# Patient Record
Sex: Female | Born: 1965 | Race: Black or African American | Hispanic: No | Marital: Single | Smoking: Never smoker
Health system: Southern US, Community
[De-identification: ages and names within clinical notes are randomized; demographics above are authoritative.]

## PROBLEM LIST (undated history)

## (undated) DIAGNOSIS — I10 Essential (primary) hypertension: Secondary | ICD-10-CM

## (undated) DIAGNOSIS — J45909 Unspecified asthma, uncomplicated: Secondary | ICD-10-CM

## (undated) DIAGNOSIS — L509 Urticaria, unspecified: Secondary | ICD-10-CM

## (undated) HISTORY — DX: Urticaria, unspecified: L50.9

## (undated) HISTORY — PX: KNEE SURGERY: SHX244

## (undated) HISTORY — DX: Unspecified asthma, uncomplicated: J45.909

## (undated) HISTORY — DX: Essential (primary) hypertension: I10

---

## 2018-11-21 ENCOUNTER — Other Ambulatory Visit: Payer: Self-pay | Admitting: Internal Medicine

## 2018-11-21 DIAGNOSIS — Z1231 Encounter for screening mammogram for malignant neoplasm of breast: Secondary | ICD-10-CM

## 2019-10-18 ENCOUNTER — Other Ambulatory Visit: Payer: Self-pay

## 2019-10-18 DIAGNOSIS — Z20822 Contact with and (suspected) exposure to covid-19: Secondary | ICD-10-CM

## 2019-10-20 LAB — NOVEL CORONAVIRUS, NAA: SARS-CoV-2, NAA: NOT DETECTED

## 2019-11-19 ENCOUNTER — Other Ambulatory Visit: Payer: Self-pay | Admitting: Obstetrics and Gynecology

## 2019-11-19 ENCOUNTER — Other Ambulatory Visit (HOSPITAL_COMMUNITY)
Admission: RE | Admit: 2019-11-19 | Discharge: 2019-11-19 | Disposition: A | Discharge status: Home / Self Care | Payer: 59 | Source: Ambulatory Visit | Attending: Obstetrics and Gynecology | Admitting: Obstetrics and Gynecology

## 2019-11-19 DIAGNOSIS — Z01419 Encounter for gynecological examination (general) (routine) without abnormal findings: Secondary | ICD-10-CM | POA: Diagnosis present

## 2019-11-21 ENCOUNTER — Other Ambulatory Visit: Payer: Self-pay | Admitting: Obstetrics and Gynecology

## 2019-11-21 DIAGNOSIS — Z1231 Encounter for screening mammogram for malignant neoplasm of breast: Secondary | ICD-10-CM

## 2019-11-22 ENCOUNTER — Other Ambulatory Visit: Payer: Self-pay

## 2019-11-22 ENCOUNTER — Ambulatory Visit
Admission: RE | Admit: 2019-11-22 | Discharge: 2019-11-22 | Disposition: A | Discharge status: Home / Self Care | Payer: 59 | Source: Ambulatory Visit | Attending: Obstetrics and Gynecology | Admitting: Obstetrics and Gynecology

## 2019-11-22 DIAGNOSIS — Z1231 Encounter for screening mammogram for malignant neoplasm of breast: Secondary | ICD-10-CM

## 2019-11-27 ENCOUNTER — Other Ambulatory Visit: Payer: Self-pay | Admitting: Obstetrics and Gynecology

## 2019-11-27 DIAGNOSIS — R928 Other abnormal and inconclusive findings on diagnostic imaging of breast: Secondary | ICD-10-CM

## 2019-11-29 ENCOUNTER — Other Ambulatory Visit: Payer: Self-pay

## 2019-11-29 ENCOUNTER — Ambulatory Visit
Admission: RE | Admit: 2019-11-29 | Discharge: 2019-11-29 | Disposition: A | Discharge status: Home / Self Care | Payer: 59 | Source: Ambulatory Visit | Attending: Obstetrics and Gynecology | Admitting: Obstetrics and Gynecology

## 2019-11-29 DIAGNOSIS — R928 Other abnormal and inconclusive findings on diagnostic imaging of breast: Secondary | ICD-10-CM

## 2020-02-26 ENCOUNTER — Other Ambulatory Visit: Payer: Self-pay | Admitting: Orthopedic Surgery

## 2020-02-26 DIAGNOSIS — M542 Cervicalgia: Secondary | ICD-10-CM

## 2020-03-01 NOTE — Progress Notes (Signed)
Cardiology Office Note:   Date:  03/04/2020  NAME:  Cassie Mahoney    MRN: [id number] DOB:  08-Nov-1966   PCP:  Renford Dills, MD  Cardiologist:  No primary care provider on file.   Referring MD: Renford Dills, MD   Chief Complaint  Patient presents with  . Chest Pain   History of Present Illness:   Cassie Mahoney is a 54 y.o. female with a hx of hypertension who is being seen today for the evaluation of chest pain at the request of Renford Dills, MD. Evaluated in the ER 2/19 for atypical CP. Troponin negative and EKG normal. Follow-up with [place] today.  She reports back in February she had an event of paralysis while talking to a colleague.  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  She reports work is stressful lately.  She reports while talking with an employee she felt like she could not move.  She did have some left-sided soreness in her chest or arm which she is unable to delineate.  Is reported as a throbbing pain that lasted seconds and resolved.  The whole paralysis that lasted 2 to 3 minutes.  She had no deficits such as slurred speech or inability to move extremities.  She just felt like she could not move or talk.  She was evaluated the emergency room and then referred to cardiology.  Her primary care physician seems to be more concerned about the chest pain and wanted her evaluated here as well.  She reports that she is never had a heart attack or stroke.  Her risk factors for heart disease include hypertension but is well controlled today.  She does snore and falls asleep easily and her primary care physician has concerns for sleep apnea.  I do not have a recent lipid profile.  She is recently relocated from [place] and is establishing care here.  She reports that she does not exercise routinely but denies any chest pain or trouble breathing with her current level of activity.  She is obese but no other strong CVD risk factors other than what is mentioned above.  It appears this chest  pain was a one-time incident and she has not had any further episodes.  Her EKG demonstrates normal sinus rhythm with no acute ST-T changes or evidence of prior infarction.  Past Medical History: Past Medical History:  Diagnosis Date  . Hypertension     Past Surgical History: Past Surgical History:  Procedure Laterality Date  . KNEE SURGERY      Current Medications: Current Meds  Medication Sig  . amLODipine (NORVASC) 10 MG tablet Take 10 mg by mouth daily.  Marland Kitchen ascorbic acid (VITAMIN C) 100 MG tablet Take by mouth.  . Cholecalciferol 25 MCG (1000 UT) capsule Take by mouth.  . losartan-hydrochlorothiazide (HYZAAR) 100-25 MG tablet Take by mouth.  . montelukast (SINGULAIR) 10 MG tablet Take by mouth.  . Multiple Vitamin (MULTIVITAMIN) capsule Take by mouth.  . potassium chloride (KLOR-CON) 20 MEQ packet Take by mouth.     Allergies:    Latex   Social History: Social History   Socioeconomic History  . Marital status: Single    Spouse name: Not on file  . Number of children: 1  . Years of education: Not on file  . Highest education level: Not on file  Occupational History  . Occupation: Health and safety inspector lab  Tobacco Use  . Smoking status: Never Smoker  . Smokeless tobacco: Never Used  Substance  and Sexual Activity  . Alcohol use: Not Currently  . Drug use: Not Currently  . Sexual activity: Not on file  Other Topics Concern  . Not on file  Social History Narrative  . Not on file   Social Determinants of Health   Financial Resource Strain:   . Difficulty of Paying Living Expenses:   Food Insecurity:   . Worried About Charity fundraiser in the Last Year:   . Arboriculturist in the Last Year:   Transportation Needs:   . Film/video editor (Medical):   Marland Kitchen Lack of Transportation (Non-Medical):   Physical Activity:   . Days of Exercise per Week:   . Minutes of Exercise per Session:   Stress:   . Feeling of Stress :   Social Connections:   . Frequency of  Communication with Friends and Family:   . Frequency of Social Gatherings with Friends and Family:   . Attends Religious Services:   . Active Member of Clubs or Organizations:   . Attends Archivist Meetings:   Marland Kitchen Marital Status:      Family History: The patient's family history includes Heart attack in her mother; Hypertension in her father and mother.  ROS:   All other ROS reviewed and negative. Pertinent positives noted in the HPI.     EKGs/Labs/Other Studies Reviewed:   The following studies were personally reviewed by me today:  EKG:  EKG is ordered today.  The ekg ordered today demonstrates normal sinus rhythm, heart rate 90, no acute ST-T changes, no evidence of prior infarction, and was personally reviewed by me.   Recent Labs: No results found for requested labs within last 8760 hours.   Recent Lipid Panel No results found for: CHOL, TRIG, HDL, CHOLHDL, VLDL, LDLCALC, LDLDIRECT  Physical Exam:   VS:  BP 124/78   Pulse 90   Temp (!) 97 F (36.1 C)   Ht 5\' 8"  (1.727 m)   Wt 279 lb (126.6 kg)   SpO2 98%   BMI 42.42 kg/m    Wt Readings from Last 3 Encounters:  03/04/20 279 lb (126.6 kg)    General: Well nourished, well developed, in no acute distress Heart: Atraumatic, normal size  Eyes: PEERLA, EOMI  Neck: Supple, no JVD Endocrine: No thryomegaly Cardiac: Normal S1, S2; RRR; no murmurs, rubs, or gallops Lungs: Clear to auscultation bilaterally, no wheezing, rhonchi or rales  Abd: Soft, nontender, no hepatomegaly  Ext: No edema, pulses 2+ Musculoskeletal: No deformities, BUE and BLE strength normal and equal Skin: Warm and dry, no rashes   Neuro: Alert and oriented to person, place, time, and situation, CNII-XII grossly intact, no focal deficits  Psych: Normal mood and affect   ASSESSMENT:   Cassie Mahoney is a 54 y.o. female who presents for the following: 1. Chest pain, unspecified type   2. Essential hypertension   3. Obesity, morbid, BMI  40.0-49.9 ([place])     PLAN:   1. Chest pain, unspecified type 2. Essential hypertension 3. Obesity, morbid, BMI 40.0-49.9 ([place]) -She presents after a one-time episode of paralysis with associated left-sided throbbing chest pain.  She is not had recurrence of the chest pain.  Her EKG today shows normal sinus rhythm without acute ST-T changes or evidence of prior infarction.  Her main CVD risk factors are hypertension which is well controlled as well as morbid obesity.  There is also concern for sleep apnea.  She is not had any further recurrence.  She reports no chest pain or shortness of breath with her current level of activity.  She has no murmurs or abnormalities detected on my examination today.  I discussed with her the possibility of pursuing a cardiac CTA just to make sure she does not have any obstructive CAD.  I think the chest pain and paralysis are very unrelated.  I am unsure if she had some sort of neurological event or possibly a stress reaction.  I encouraged her to follow with her primary care physician for further evaluation of this.  She reports for now she would like to hold on a cardiac CTA.  Should she have further episodes she will let us know and we will go ahead and order that.  We will see her back in 3 months to reevaluate her symptoms.  Overall I have a low suspicion this is cardiac chest pain I suspect this is noncardiac chest pain in the setting of some neurological event.   Disposition: Return in about 3 months (around 06/04/2020).  Medication Adjustments/Labs and Tests Ordered: Current medicines are reviewed at length with the patient today.  Concerns regarding medicines are outlined above.  Orders Placed This Encounter  Procedures  . EKG 12-Lead   No orders of the defined types were placed in this encounter.   Patient Instructions  Medication Instructions:  The current medical regimen is effective;  continue present plan and medications.  *If you need a refill on  your cardiac medications before your next appointment, please call your pharmacy*   Follow-Up: At [facility], you and your health needs are our priority.  As part of our continuing mission to provide you with exceptional heart care, we have created designated Provider Care Teams.  These Care Teams include your primary Cardiologist (physician) and Advanced Practice Providers (APPs -  Physician Assistants and Nurse Practitioners) who all work together to provide you with the care you need, when you need it.  We recommend signing up for the patient portal called "MyChart".  Sign up information is provided on this After Visit Summary.  MyChart is used to connect with patients for Virtual Visits (Telemedicine).  Patients are able to view lab/test results, encounter notes, upcoming appointments, etc.  Non-urgent messages can be sent to your provider as well.   To learn more about what you can do with MyChart, go to [email or web address].    Your next appointment:   3 month(s)  The format for your next appointment:   Virtual Visit   Provider:   Lennie Odor, MD      Signed, Lenna Gilford. Flora Lipps, MD [facility]  [address], [address] [place], [place] [address] [phone]  03/04/2020 10:15 AM

## 2020-03-04 ENCOUNTER — Ambulatory Visit (INDEPENDENT_AMBULATORY_CARE_PROVIDER_SITE_OTHER): Payer: 59 | Admitting: Cardiovascular Disease

## 2020-03-04 ENCOUNTER — Other Ambulatory Visit: Payer: Self-pay

## 2020-03-04 ENCOUNTER — Encounter: Payer: Self-pay | Admitting: Cardiovascular Disease

## 2020-03-04 VITALS — BP 124/78 | HR 90 | Temp 97.0°F | Ht 68.0 in | Wt 279.0 lb

## 2020-03-04 DIAGNOSIS — R079 Chest pain, unspecified: Secondary | ICD-10-CM | POA: Diagnosis not present

## 2020-03-04 DIAGNOSIS — I10 Essential (primary) hypertension: Secondary | ICD-10-CM

## 2020-03-04 NOTE — Patient Instructions (Signed)
Medication Instructions:  The current medical regimen is effective;  continue present plan and medications.  *If you need a refill on your cardiac medications before your next appointment, please call your pharmacy*   Follow-Up: At [facility], you and your health needs are our priority.  As part of our continuing mission to provide you with exceptional heart care, we have created designated Provider Care Teams.  These Care Teams include your primary Cardiologist (physician) and Advanced Practice Providers (APPs -  Physician Assistants and Nurse Practitioners) who all work together to provide you with the care you need, when you need it.  We recommend signing up for the patient portal called "MyChart".  Sign up information is provided on this After Visit Summary.  MyChart is used to connect with patients for Virtual Visits (Telemedicine).  Patients are able to view lab/test results, encounter notes, upcoming appointments, etc.  Non-urgent messages can be sent to your provider as well.   To learn more about what you can do with MyChart, go to [email or web address].    Your next appointment:   3 month(s)  The format for your next appointment:   Virtual Visit   Provider:   Lennie Odor, MD

## 2020-03-26 ENCOUNTER — Ambulatory Visit
Admission: RE | Admit: 2020-03-26 | Discharge: 2020-03-26 | Disposition: A | Discharge status: Home / Self Care | Payer: 59 | Source: Ambulatory Visit | Attending: Orthopedic Surgery | Admitting: Orthopedic Surgery

## 2020-03-26 ENCOUNTER — Other Ambulatory Visit: Payer: Self-pay

## 2020-03-26 DIAGNOSIS — M542 Cervicalgia: Secondary | ICD-10-CM

## 2020-05-11 NOTE — Progress Notes (Signed)
Virtual Visit via Telephone Note   This visit type was conducted due to national recommendations for restrictions regarding the COVID-19 Pandemic (e.g. social distancing) in an effort to limit this patient's exposure and mitigate transmission in our community.  Due to her co-morbid illnesses, this patient is at least at moderate risk for complications without adequate follow up.  This format is felt to be most appropriate for this patient at this time.  The patient did not have access to video technology/had technical difficulties with video requiring transitioning to audio format only (telephone).  All issues noted in this document were discussed and addressed.  No physical exam could be performed with this format.  Please refer to the patient's chart for her  consent to telehealth for [facility].   The patient was identified using 2 identifiers.  Date:  05/15/2020   ID:  Cassie Mahoney, DOB 1966/08/22, MRN [id number]  Patient Location: Home Provider Location: Office  PCP:  Renford Dills, MD  Cardiologist:  No primary care provider on file.   Evaluation Performed:  Follow-Up Visit  Chief Complaint:  Chest pain  History of Present Illness:    Cassie Mahoney is a 54 y.o. female with a hx of obesity, hypertension who presents for follow-up of chest pain. Evaluated 3/24 for atypical CP and left arm paralysis. Had normal EKG. No testing pursued due to atypical symptoms, normal EKG, and no further recurrence. Follow-up today.   She reports has not had any further episodes of chest pain.  She reports was at work and had left arm paralysis with a throbbing sensation in her chest.  I did evaluate her and her EKG was normal.  We decided for watchful waiting.  She said no further recurrence of episodes of chest pain.  She reports she is started exercise more.  This includes 15 to 20-minute of vigorous exercise aerobic activity.  This includes dancing or using videos at home.  She reports she can also  do less vigorous activity for up to 30 minutes without any symptoms of chest pain or shortness of breath.  She started to work on the diet.  Given no further recurrence of chest pain we discussed that watchful waiting is likely the best plan.  Should she develop further chest pain she will reach back out to [place].  The patient does not have symptoms concerning for COVID-19 infection (fever, chills, cough, or new shortness of breath).    Past Medical History:  Diagnosis Date  . Hypertension    Past Surgical History:  Procedure Laterality Date  . KNEE SURGERY       Current Meds  Medication Sig  . amLODipine (NORVASC) 10 MG tablet Take 10 mg by mouth daily.  Marland Kitchen ascorbic acid (VITAMIN C) 100 MG tablet Take by mouth.  . Cholecalciferol 25 MCG (1000 UT) capsule Take by mouth.  . losartan-hydrochlorothiazide (HYZAAR) 100-25 MG tablet Take by mouth.  . montelukast (SINGULAIR) 10 MG tablet Take by mouth.  . Multiple Vitamin (MULTIVITAMIN) capsule Take by mouth.  . potassium chloride (KLOR-CON) 20 MEQ packet Take by mouth.     Allergies:   Latex   Social History   Tobacco Use  . Smoking status: Never Smoker  . Smokeless tobacco: Never Used  Substance Use Topics  . Alcohol use: Not Currently  . Drug use: Not Currently     Family Hx: The patient's family history includes Heart attack in her mother; Hypertension in her father and mother.  ROS:  Please see the history of present illness.     All other systems reviewed and are negative.   Prior CV studies:   The following studies were reviewed today:  Labs/Other Tests and Data Reviewed:    EKG:  No ECG reviewed.  Recent Labs: No results found for requested labs within last 8760 hours.   Recent Lipid Panel No results found for: CHOL, TRIG, HDL, CHOLHDL, LDLCALC, LDLDIRECT  Wt Readings from Last 3 Encounters:  05/15/20 275 lb (124.7 kg)  03/04/20 279 lb (126.6 kg)     Objective:    Vital Signs:  Ht 5\' 8"  (1.727 m)   Wt  275 lb (124.7 kg)   BMI 41.81 kg/m    VITAL SIGNS:  reviewed  General: No acute distress Pulmonary: No shortness of breath over the phone Psych: Normal mood and affect  ASSESSMENT & PLAN:    1. Chest pain, unspecified type -She was evaluated in March for very atypical chest pain.  She had left arm paralysis with throbbing left chest pain.  This was a one-time occurrence.  Likely associated with stress at work.  She had no further recurrence of chest pain since that time.  She is able to maintain a high level of activity without any symptoms of chest pain.  We will hold on a cardiac CTA for now.  I see no further need for cardiac testing.  She will see [place] as needed.  2. Essential hypertension -No change in medications today.  She will follow the primary care physician.  COVID-19 Education: The signs and symptoms of COVID-19 were discussed with the patient and how to seek care for testing (follow up with PCP or arrange E-visit).  The importance of social distancing was discussed today.  Time:  Today, I have spent 15 minutes with the patient with telehealth technology discussing the above problems.     Medication Adjustments/Labs and Tests Ordered: Current medicines are reviewed at length with the patient today.  Concerns regarding medicines are outlined above.   Tests Ordered: No orders of the defined types were placed in this encounter.   Medication Changes: No orders of the defined types were placed in this encounter.   Follow Up:  Either In Person or Virtual prn  Signed, Evalina Field, MD  05/15/2020 10:20 AM    [place]

## 2020-05-15 ENCOUNTER — Encounter: Payer: Self-pay | Admitting: Cardiovascular Disease

## 2020-05-15 ENCOUNTER — Telehealth (INDEPENDENT_AMBULATORY_CARE_PROVIDER_SITE_OTHER): Payer: 59 | Admitting: Cardiovascular Disease

## 2020-05-15 VITALS — Ht 68.0 in | Wt 275.0 lb

## 2020-05-15 DIAGNOSIS — R079 Chest pain, unspecified: Secondary | ICD-10-CM

## 2020-05-15 DIAGNOSIS — I10 Essential (primary) hypertension: Secondary | ICD-10-CM | POA: Diagnosis not present

## 2020-05-15 NOTE — Patient Instructions (Signed)
Medication Instructions:  The current medical regimen is effective;  continue present plan and medications.  *If you need a refill on your cardiac medications before your next appointment, please call your pharmacy*    Follow-Up: At [facility], you and your health needs are our priority.  As part of our continuing mission to provide you with exceptional heart care, we have created designated Provider Care Teams.  These Care Teams include your primary Cardiologist (physician) and Advanced Practice Providers (APPs -  Physician Assistants and Nurse Practitioners) who all work together to provide you with the care you need, when you need it.  We recommend signing up for the patient portal called "MyChart".  Sign up information is provided on this After Visit Summary.  MyChart is used to connect with patients for Virtual Visits (Telemedicine).  Patients are able to view lab/test results, encounter notes, upcoming appointments, etc.  Non-urgent messages can be sent to your provider as well.   To learn more about what you can do with MyChart, go to [email or web address].    Your next appointment:   As needed  The format for your next appointment:   In Person  Provider:   [place] O'Neal, MD      

## 2020-10-29 ENCOUNTER — Other Ambulatory Visit: Payer: Self-pay | Admitting: Obstetrics and Gynecology

## 2020-10-29 DIAGNOSIS — Z1231 Encounter for screening mammogram for malignant neoplasm of breast: Secondary | ICD-10-CM

## 2020-12-10 ENCOUNTER — Other Ambulatory Visit: Payer: Self-pay

## 2020-12-10 ENCOUNTER — Ambulatory Visit
Admission: RE | Admit: 2020-12-10 | Discharge: 2020-12-10 | Disposition: A | Discharge status: Home / Self Care | Payer: 59 | Source: Ambulatory Visit | Attending: Obstetrics and Gynecology | Admitting: Obstetrics and Gynecology

## 2020-12-10 DIAGNOSIS — Z1231 Encounter for screening mammogram for malignant neoplasm of breast: Secondary | ICD-10-CM

## 2021-05-24 ENCOUNTER — Other Ambulatory Visit: Payer: Self-pay

## 2021-05-24 ENCOUNTER — Ambulatory Visit (INDEPENDENT_AMBULATORY_CARE_PROVIDER_SITE_OTHER): Payer: BC Managed Care – PPO

## 2021-05-24 ENCOUNTER — Ambulatory Visit (INDEPENDENT_AMBULATORY_CARE_PROVIDER_SITE_OTHER): Payer: BC Managed Care – PPO | Admitting: Podiatry

## 2021-05-24 DIAGNOSIS — M6740 Ganglion, unspecified site: Secondary | ICD-10-CM

## 2021-05-24 NOTE — Progress Notes (Signed)
   HPI: 55 y.o. female presenting today as a new patient for what she believes is a ganglion cyst to her left foot.  She also experiences it to the right foot but its not as severe.  This is been ongoing for about a month now.  She does have a history of ganglion cysts to the right wrist which went away spontaneously.  She states that she wears a brace when she has a flareup of the ganglion cyst to the right wrist.  She presents for further treatment and evaluation  Past Medical History:  Diagnosis Date   Hypertension      Physical Exam: General: The patient is alert and oriented x3 in no acute distress.  Dermatology: Skin is warm, dry and supple bilateral lower extremities. Negative for open lesions or macerations.  Vascular: Palpable pedal pulses bilaterally. No edema or erythema noted. Capillary refill within normal limits.  Neurological: Epicritic and protective threshold grossly intact bilaterally.   Musculoskeletal Exam: Range of motion within normal limits to all pedal and ankle joints bilateral. Muscle strength 5/5 in all groups bilateral. Fluctuant, non-adhered mass noted to the dorsal aspect of the left foot.  Radiographic Exam:  Normal osseous mineralization. Joint spaces preserved. No fracture/dislocation/boney destruction.    Assessment: 1.  Ganglion cyst left foot   Plan of Care:  1. Patient evaluated. X-Rays reviewed.  2.  Explained to the patient the etiology and diagnosis of a ganglion cyst.  Currently it is very minimally symptomatic.  I do not recommend surgery at this time and recommend conservative treatment. 3.  Recommend good supportive shoes that do not irritate or aggravate the dorsum of the left foot 4.  Compression ankle sleeve dispensed to wear as needed 5.  Return to clinic as needed  *Works for [employer]; makes glasses. Biggest contract is with [employer]      Felecia Shelling, DPM [facility]  Dr. Felecia Shelling, DPM    [address] [place], [place] [address]                Office [phone]  Fax [phone]

## 2021-09-24 ENCOUNTER — Other Ambulatory Visit: Payer: Self-pay | Admitting: Obstetrics and Gynecology

## 2021-09-24 DIAGNOSIS — Z1231 Encounter for screening mammogram for malignant neoplasm of breast: Secondary | ICD-10-CM

## 2022-07-06 IMAGING — MG DIGITAL SCREENING BILAT W/ TOMO W/ CAD
8 series · 8 of 24 positions shown · non-contrast
Comparison: Previous exam(s).

CLINICAL DATA: Screening.

EXAM:
DIGITAL SCREENING BILATERAL MAMMOGRAM WITH TOMO AND CAD

[R CC synth-2D]
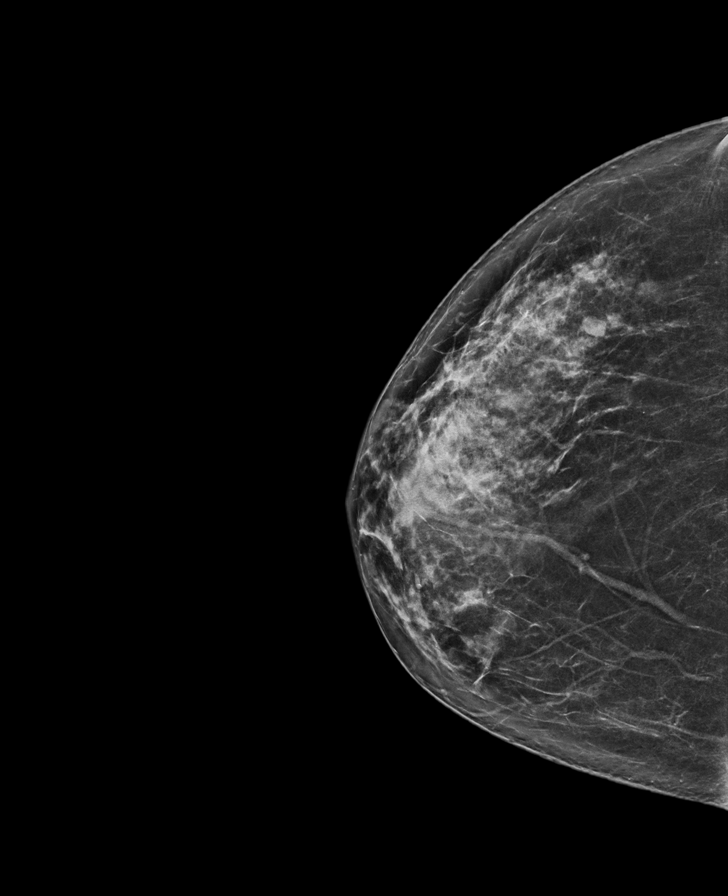

[L MLO synth-2D]
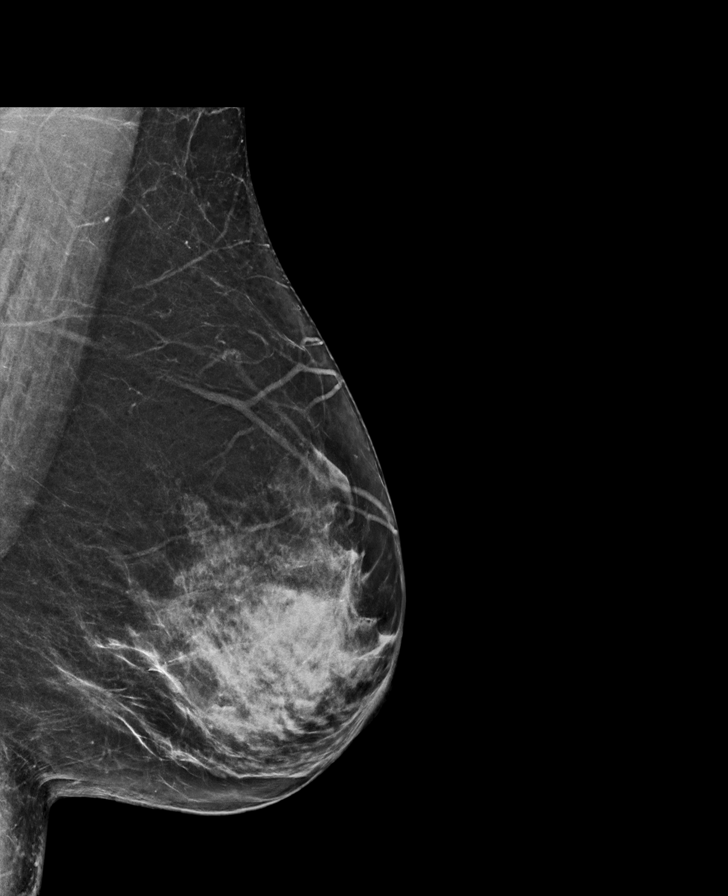

[L CC synth-2D]
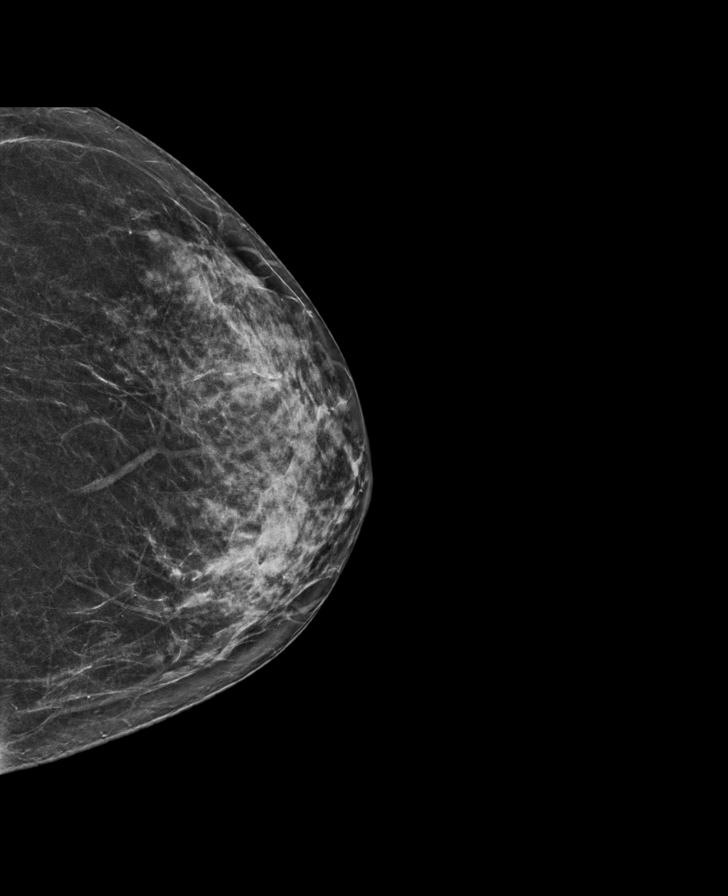

[R MLO synth-2D]
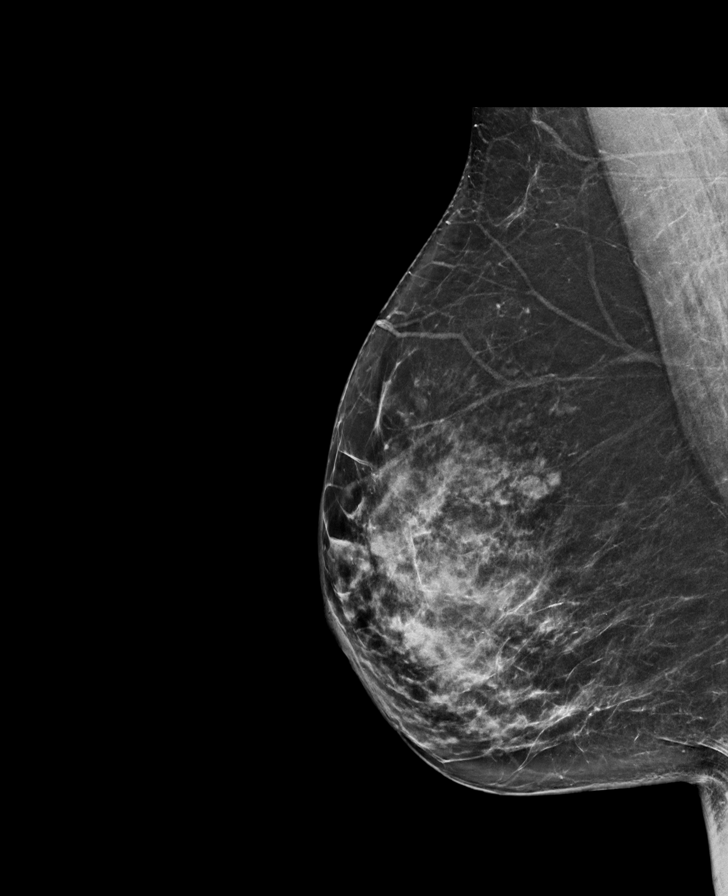

[R CC tomo · tomo slice 37/73.0]
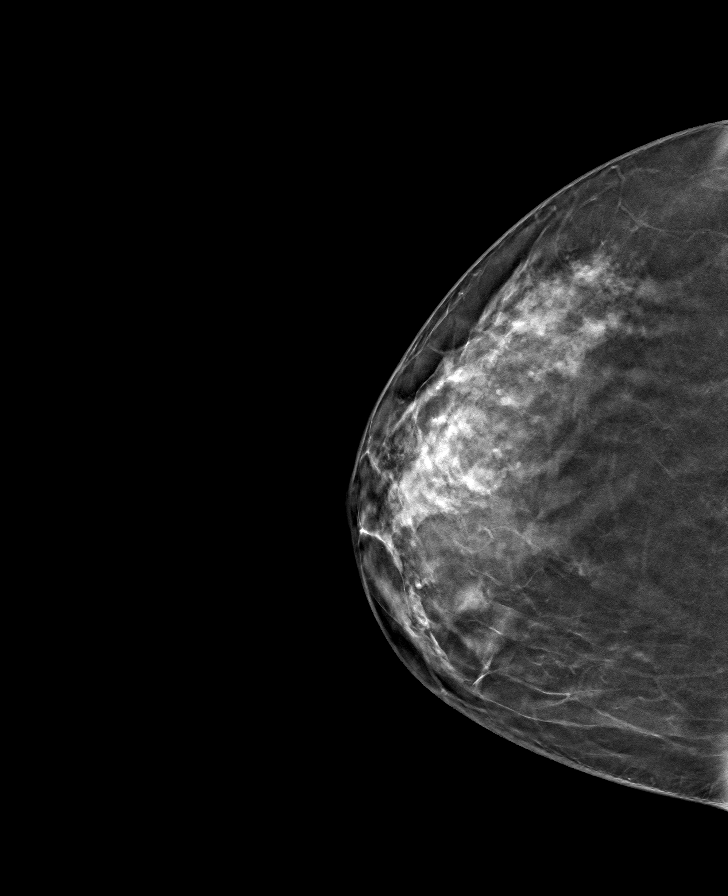

[R MLO tomo · tomo slice 38/75.0]
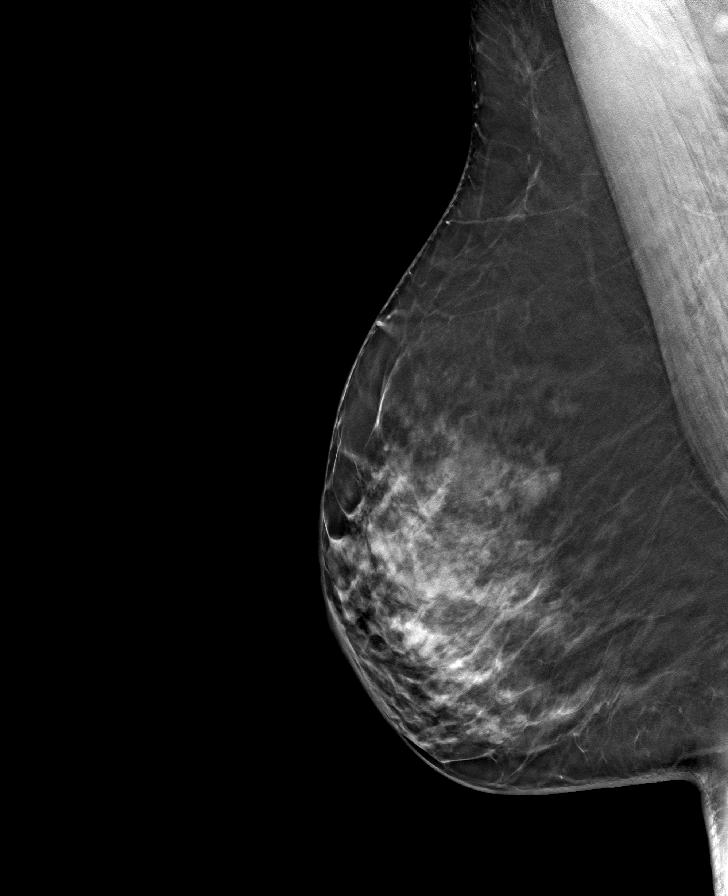

[L CC tomo · tomo slice 33/65.0]
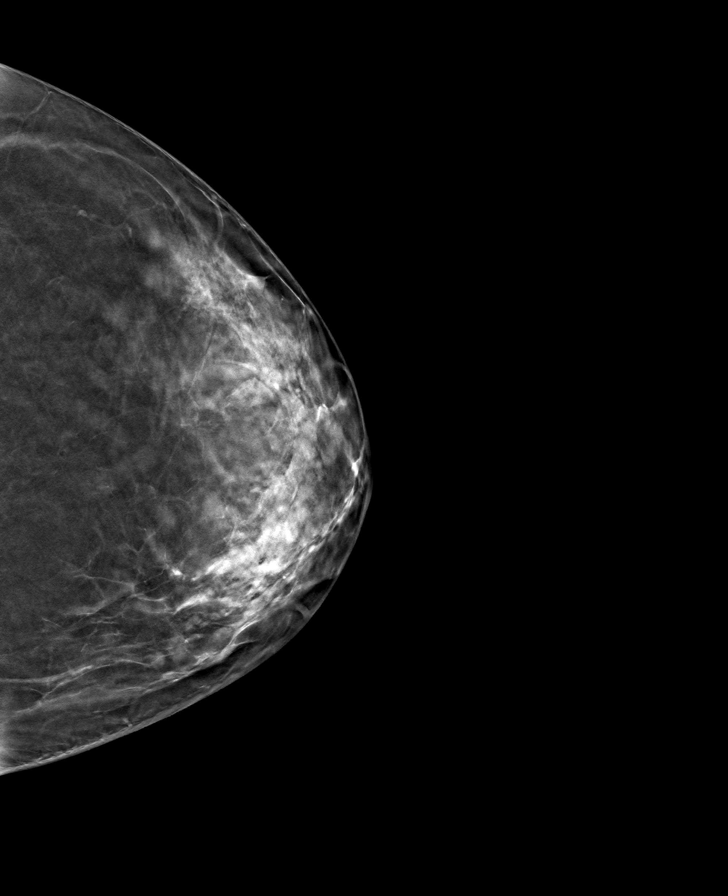

[L MLO tomo · tomo slice 37/73.0]
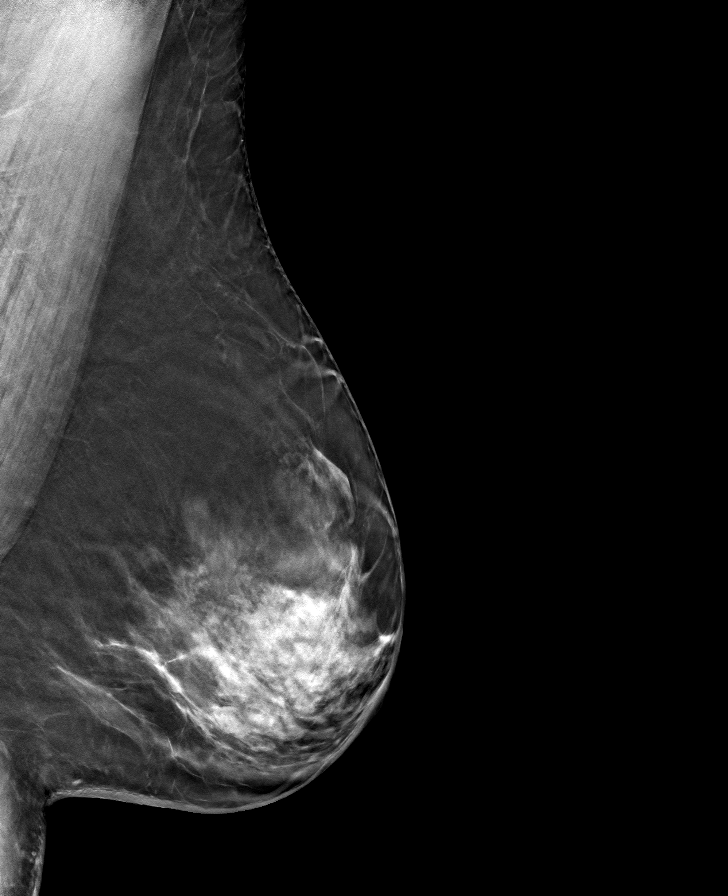

[8 of 24 positions shown; findings below may reference images not displayed]

ACR Breast Density Category c: The breast tissue is heterogeneously
dense, which may obscure small masses.
FINDINGS: There are no findings suspicious for malignancy. Images were
processed with CAD.
IMPRESSION: No mammographic evidence of malignancy. A result letter of this
screening mammogram will be mailed directly to the patient.

RECOMMENDATION:
Screening mammogram in one year. (Code:FT-U-LHB)

BI-RADS CATEGORY  1: Negative.

## 2023-05-15 ENCOUNTER — Ambulatory Visit (INDEPENDENT_AMBULATORY_CARE_PROVIDER_SITE_OTHER): Payer: Self-pay

## 2023-05-15 ENCOUNTER — Ambulatory Visit (INDEPENDENT_AMBULATORY_CARE_PROVIDER_SITE_OTHER): Payer: Self-pay | Admitting: Podiatry

## 2023-05-15 DIAGNOSIS — S92912A Unspecified fracture of left toe(s), initial encounter for closed fracture: Secondary | ICD-10-CM

## 2023-05-15 DIAGNOSIS — M79672 Pain in left foot: Secondary | ICD-10-CM

## 2023-05-24 NOTE — Progress Notes (Signed)
   Chief Complaint  Patient presents with   Toe Pain    Patient had a left foot 4th toe injury, patient hit her toe a month ago on her bed, toe has some swelling and pain, rate of pain 4 out of 10, patient is using compression socks to help with the pain, X-Rays done today     HPI: 57 y.o. female presenting today for new complaint of pain and tenderness associated to the left fourth toe when she hit her toe against her bed about 1 month ago.  She continues to have pain with swelling.  She was hoping that the pain would resolve but she continues to have pain and tenderness and presents for further treatment and evaluation.  Currently she has not anything for treatment  Past Medical History:  Diagnosis Date   Hypertension     Past Surgical History:  Procedure Laterality Date   KNEE SURGERY      Allergies  Allergen Reactions   Latex Hives   Apple Itching     Physical Exam: General: The patient is alert and oriented x3 in no acute distress.  Dermatology: Skin is warm, dry and supple bilateral lower extremities.   Vascular: Palpable pedal pulses bilaterally. Capillary refill within normal limits.  No erythema.  There is some moderate edema noted to the fourth digit of the left foot  Neurological: Grossly intact via light touch  Musculoskeletal Exam: No pedal deformities noted.  Gross alignment of the toes noted.  Tenderness with palpation also noted to the fourth digit left foot  Radiographic Exam LT foot 05/15/2023:  Normal osseous mineralization.  Obliquely oriented nondisplaced fracture noted at the base of the proximal phalanx of the fourth digit.  Impression: Fracture fourth digit left foot proximal phalanx  Assessment/Plan of Care: 1.  Fracture fourth digit LT foot base of the proximal phalanx; closed, nondisplaced, initial encounter  -Patient evaluated.  X-rays reviewed -Recommend conservative treatment.  RICE -Postsurgical shoe dispensed.  WBAT x 4 weeks -Return to  clinic 6 weeks follow-up x-ray       Felecia Shelling, DPM [facility]  Dr. Felecia Shelling, DPM    [address] [place], [place] [address]                Office [phone]  Fax [phone]

## 2023-06-02 ENCOUNTER — Other Ambulatory Visit: Payer: Self-pay | Admitting: Podiatry

## 2023-06-02 DIAGNOSIS — T148XXA Other injury of unspecified body region, initial encounter: Secondary | ICD-10-CM

## 2023-06-02 DIAGNOSIS — S92912A Unspecified fracture of left toe(s), initial encounter for closed fracture: Secondary | ICD-10-CM

## 2023-06-26 ENCOUNTER — Ambulatory Visit: Payer: Self-pay | Admitting: Podiatry

## 2023-07-26 ENCOUNTER — Ambulatory Visit (INDEPENDENT_AMBULATORY_CARE_PROVIDER_SITE_OTHER): Payer: 59 | Admitting: Podiatry

## 2023-07-26 ENCOUNTER — Ambulatory Visit (INDEPENDENT_AMBULATORY_CARE_PROVIDER_SITE_OTHER): Payer: 59

## 2023-07-26 DIAGNOSIS — S92415D Nondisplaced fracture of proximal phalanx of left great toe, subsequent encounter for fracture with routine healing: Secondary | ICD-10-CM | POA: Diagnosis not present

## 2023-07-26 NOTE — Progress Notes (Signed)
   Chief Complaint  Patient presents with   Toe Injury    Still having some swelling, no pain. Wearing regular shoes.     HPI: 57 y.o. female presenting today for follow-up evaluation of a fracture to the left fourth toe which occurred around the beginning of May 2024 when she hit her foot against her bedpost.  Patient doing well.  She is back into regular shoes with no pain.  Past Medical History:  Diagnosis Date   Hypertension     Past Surgical History:  Procedure Laterality Date   KNEE SURGERY      Allergies  Allergen Reactions   Latex Hives   Apple Itching     Physical Exam: General: The patient is alert and oriented x3 in no acute distress.  Dermatology: Skin is warm, dry and supple bilateral lower extremities.   Vascular: Palpable pedal pulses bilaterally. Capillary refill within normal limits.  No erythema.  There is some moderate edema noted to the fourth digit of the left foot  Neurological: Grossly intact via light touch  Musculoskeletal Exam: No pedal deformities noted.  Gross alignment of the toes noted.  Negative for any appreciable tenderness with palpation also noted to the fourth digit left foot  Radiographic Exam LT foot 05/15/2023:  Normal osseous mineralization.  Obliquely oriented nondisplaced fracture noted at the base of the proximal phalanx of the fourth digit.  Impression: Fracture fourth digit left foot proximal phalanx  Radiographic exam LT foot 07/26/2023: Osseous callus formation noted around the fracture site of the proximal phalanx fourth digit.  Overall the toe continues to be in decent rectus alignment.  Assessment/Plan of Care: 1.  Fracture fourth digit LT foot base of the proximal phalanx; closed, nondisplaced, squint encounter with routine healing  -Patient evaluated.  X-rays reviewed and compared to prior x-rays - Patient no longer has any pain or tenderness associated to the toe.  She may slowly increase to full activity no restrictions  of the following month -Recommend good supportive tennis shoes and sneakers that allow plenty of room in the toebox area -Return to clinic as needed  *Works for [employer]; makes glasses. Biggest contract is with [employer]      Felecia Shelling, DPM [facility]  Dr. Felecia Shelling, DPM    [address] [place], [place] [address]                Office [phone]  Fax [phone]

## 2023-10-04 ENCOUNTER — Ambulatory Visit (INDEPENDENT_AMBULATORY_CARE_PROVIDER_SITE_OTHER): Payer: 59 | Admitting: Allergy

## 2023-10-04 ENCOUNTER — Encounter: Payer: Self-pay | Admitting: Allergy

## 2023-10-04 VITALS — BP 118/78 | HR 77 | Temp 97.8°F | Resp 18 | Ht 68.0 in | Wt 279.7 lb

## 2023-10-04 DIAGNOSIS — L501 Idiopathic urticaria: Secondary | ICD-10-CM

## 2023-10-04 DIAGNOSIS — J45990 Exercise induced bronchospasm: Secondary | ICD-10-CM | POA: Diagnosis not present

## 2023-10-04 DIAGNOSIS — K219 Gastro-esophageal reflux disease without esophagitis: Secondary | ICD-10-CM

## 2023-10-04 DIAGNOSIS — L409 Psoriasis, unspecified: Secondary | ICD-10-CM

## 2023-10-04 DIAGNOSIS — T781XXD Other adverse food reactions, not elsewhere classified, subsequent encounter: Secondary | ICD-10-CM

## 2023-10-04 DIAGNOSIS — J3089 Other allergic rhinitis: Secondary | ICD-10-CM

## 2023-10-04 MED ORDER — RYALTRIS 665-25 MCG/ACT NA SUSP: 2.0000 | Freq: Two times a day (BID) | NASAL | 5 refills | Status: DC

## 2023-10-04 MED ORDER — AIRSUPRA 90-80 MCG/ACT IN AERO: 2.0000 | INHALATION_SPRAY | RESPIRATORY_TRACT | 5 refills | Status: DC | PRN

## 2023-10-04 MED ORDER — EPINEPHRINE 0.3 MG/0.3ML IJ SOAJ: 0.3000 mg | INTRAMUSCULAR | 1 refills | Status: AC | PRN

## 2023-10-04 NOTE — Progress Notes (Signed)
New Patient Note  RE: Cassie Mahoney MRN: [id number] DOB: [birth year] Date of Office Visit: 10/04/2023  Primary care provider: Renford Dills, MD  Chief Complaint: Allergies  History of present illness: Cassie Mahoney is a 57 y.o. female presenting today for evaluation of multiple allergies.  Discussed the use of AI scribe software for clinical note transcription with the patient, who gave verbal consent to proceed.  She has a history of environmental allergies states her testing would have been 2015 or so and states she did have positive results.  She reports persistent throat clearing, particularly at night.  This at times will wake her up from sleep.  She reports no significant nasal congestion, sneezing or eye symptoms, and denies sinus pressure or infections requiring antibiotics. She uses azelastine nasal spray and levocetirizine, both at night, and montelukast in the morning. Recently, she started taking an additional half pill of levocetirizine in the morning due to facial itching as advised by her previous allergist in [place] that she was seen virtually.  She has been using Breo inhaler (200/25) for approximately five years, which she believes helps with the throat clearing.  She does not believe she has a history of asthma however she states her previous allergist as says she had asthma.  She denies having any issues with recurrent coughing or wheezing or shortness of breath or chest tightness.  She reports sometimes when she was more active and exercising that she might be winded with activity but attributed that to conditioning and her weight.  She does not currently have an rescue inhaler.    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 She has been managing this by avoiding gala apples and she has an EpiPen outdated as needed.  She states she can eat  other apples but does not consume on a regular basis.  She also has been able to consume with apples without issue.  The patient has been diagnosed with psoriasis, which presents as patches on various parts of the body, including the scalp, leading to some hair thinning. She uses clobetasol ointment for this condition.  She has been following with a dermatologist.  The patient also reports a history of reflux, for which she takes Pepcid and Prilosec.  She does not feel these medications provide control of her reflux symptoms.  She has noticed occasional episodes of hives on the face and generalized itching, which she describes as "mini hives" over the past month or so.  These episodes seem to be random and have led to an increase in her dose of levocetirizine to the addition of half tablet in the morning.    Review of systems: 10pt ROS negative unless noted above in HPI  All other systems negative unless noted above in HPI  Past medical history: Past Medical History:  Diagnosis Date   Asthma    Hypertension    Urticaria     Past surgical history: Past Surgical History:  Procedure Laterality Date   KNEE SURGERY      Family history:  Family History  Problem Relation Age of Onset   Allergic rhinitis Mother    Hypertension Mother    Heart attack Mother    Hypertension Father    Asthma Neg Hx    Eczema Neg Hx    Urticaria Neg Hx     Social history: Lives  in an apartment with carpeting with electric heating and central cooling.  No pets in the home.  There is no concern for water damage, mildew or roaches in the home.  She is a Aeronautical engineer.  She denies a smoking history.  Medication List: Current Outpatient Medications  Medication Sig Dispense Refill   ascorbic acid (VITAMIN C) 100 MG tablet Take by mouth.     azelastine (ASTELIN) 0.1 % nasal spray Place into the nose.     calcipotriene (DOVONOX) 0.005 % cream Apply 0.005 Applications topically 2 (two) times  daily.     Cholecalciferol 25 MCG (1000 UT) capsule Take by mouth.     Clobetasol Propionate (TEMOVATE) 0.05 % external spray Apply 0.05 % topically 2 (two) times daily.     EPINEPHrine 0.3 mg/0.3 mL IJ SOAJ injection Inject 0.3 mg into the muscle as needed for anaphylaxis.     famotidine (PEPCID) 20 MG tablet Take by mouth.     fluticasone furoate-vilanterol (BREO ELLIPTA) 200-25 MCG/ACT AEPB Inhale into the lungs.     losartan-hydrochlorothiazide (HYZAAR) 100-25 MG tablet Take by mouth.     montelukast (SINGULAIR) 10 MG tablet Take by mouth.     Multiple Vitamin (MULTIVITAMIN) capsule Take by mouth.     omeprazole (PRILOSEC) 10 MG capsule Take by mouth.     potassium chloride (KLOR-CON) 20 MEQ packet Take by mouth.     No current facility-administered medications for this visit.    Known medication allergies: Allergies  Allergen Reactions   Latex Hives   Amlodipine Besylate Itching   Apple Itching     Physical examination: Blood pressure 118/78, pulse 77, temperature 97.8 F (36.6 C), temperature source Temporal, resp. rate 18, height 5\' 8"  (1.727 m), weight 279 lb 11.2 oz (126.9 kg), SpO2 97%.  General: Alert, interactive, in no acute distress. HEENT: PERRLA, TMs pearly gray, turbinates minimally edematous without discharge, post-pharynx non erythematous. Neck: Supple without lymphadenopathy. Lungs: Clear to auscultation without wheezing, rhonchi or rales. {no increased work of breathing. CV: Normal S1, S2 without murmurs. Abdomen: Nondistended, nontender. Skin: Hyperpigmented plaque on the right lateral leg, right and with a hyperpigmented macule Extremities:  No clubbing, cyanosis or edema. Neuro:   Grossly intact.  Diagnositics/Labs:  Spirometry: FEV1: 2.57 L 102%, FVC: 2.95 L 92%, ratio consistent with nonobstructive pattern  Assessment and plan: Allergic Rhinitis Predominant symptom of throat clearing, likely due to postnasal drip. Long-term use of azelastine and  levocetirizine may have led to decreased efficacy. -Switch azelastine to Ryaltris nasal spray 2 sprays each nostril 1-2 times a day for nasal drainage or congestion control.  This is a combination spray with olopatadine (antihistamine) for drainage and mometasone (steroid) for congestion. Stop Fluticasone spray while on Ryaltris -Switch levocetirizine to Allegra 180mg  daily.  If needed can take additional dose.  -Consider rotating between Allegra and levocetirizine every six months if both are effective. -Continue montelukast as prescribed. -Will obtain environmental allergy testing via bloodwork  Respiratory symptoms - throat clearing Exercise-induced bronchospasm Long-term use of Breo 200/25, but symptoms described are more consistent with upper airway issues rather than lower airway.  Do not believe you have asthma.   -Lung function testing is normal! -Reduce Breo to every other day and monitor for any changes in symptoms.  If doing well on every other day for a week or two then discontinue use and monitor symptoms. -Use Airsupra, an as-needed inhaler, 2 puffs every 4-6 hours as needed for cough/wheeze/shortness of breath/chest tightness.  May use 15-20 minutes prior to activity.   Monitor frequency of use.    Gastroesophageal Reflux Disease (GERD) Currently on Pepcid and Prilosec with persistent symptoms. -Discontinue Pepcid due to potential decreased efficacy. -Switch Prilosec to Nexium daily.  Psoriasis Long-standing diagnosis with patches on various parts of the body. -Continue clobetasol as prescribed by dermatology  Adverse food reaction -Continue to avoid gala apples -Will check apple IgE -You have access to epinephrine device (Epipen or AuviQ) 0.3mg  at all times -Follow emergency action plan in case of allergic reaction  Hives  -At this time etiology of hives is unknown.  Hives can be caused by a variety of different triggers including illness/infection, foods, medications,  stings, exercise, pressure, vibrations, extremes of temperature to name a few however majority of the time there is no identifiable trigger.   -Will obtain labwork to evaluate: CBC w diff, CMP, tryptase, hive panel, environmental panel, alpha-gal panel -Use Allegra as above  Follow-up in 4-6 months or sooner if needed  I appreciate the opportunity to take part in Cassie Mahoney's care. Please do not hesitate to contact me with questions.  Sincerely,   Margo Aye, MD Allergy/Immunology [facility]

## 2023-10-04 NOTE — Addendum Note (Signed)
Addended by: Philipp Deputy on: 10/04/2023 05:24 PM   Modules accepted: Orders

## 2023-10-04 NOTE — Patient Instructions (Addendum)
Allergic Rhinitis Predominant symptom of throat clearing, likely due to postnasal drip. Long-term use of azelastine and levocetirizine may have led to decreased efficacy. -Switch azelastine to Ryaltris nasal spray 2 sprays each nostril 1-2 times a day for nasal drainage or congestion control.  This is a combination spray with olopatadine (antihistamine) for drainage and mometasone (steroid) for congestion. Stop Fluticasone spray while on Ryaltris -Switch levocetirizine to Allegra 180mg  daily.  If needed can take additional dose.  -Consider rotating between Allegra and levocetirizine every six months if both are effective. -Continue montelukast as prescribed. -Will obtain environmental allergy testing via bloodwork  Respiratory symptoms - throat clearing Long-term use of Breo 200/25, but symptoms described are more consistent with upper airway issues rather than lower airway.  Do not believe you have asthma.   -Lung function testing is normal! -Reduce Breo to every other day and monitor for any changes in symptoms.  If doing well on every other day for a week or two then discontinue use and monitor symptoms. -Use Airsupra, an as-needed inhaler, 2 puffs every 4-6 hours as needed for cough/wheeze/shortness of breath/chest tightness.  May use 15-20 minutes prior to activity.   Monitor frequency of use.    Gastroesophageal Reflux Disease (GERD) Currently on Pepcid and Prilosec with persistent symptoms. -Discontinue Pepcid due to potential decreased efficacy. -Switch Prilosec to Nexium daily.  Psoriasis Long-standing diagnosis with patches on various parts of the body. -Continue clobetasol as prescribed by dermatology  Adverse food reaction -Continue to avoid gala apples -Will check apple IgE -You have access to epinephrine device (Epipen or AuviQ) 0.3mg  at all times -Follow emergency action plan in case of allergic reaction  Hives  -At this time etiology of hives is unknown.  Hives can be  caused by a variety of different triggers including illness/infection, foods, medications, stings, exercise, pressure, vibrations, extremes of temperature to name a few however majority of the time there is no identifiable trigger.   -Will obtain labwork to evaluate: CBC w diff, CMP, tryptase, hive panel, environmental panel, alpha-gal panel -Use Allegra as above  Follow-up in 4-6 months or sooner if needed

## 2023-10-13 LAB — COMPREHENSIVE METABOLIC PANEL
ALT: 21 [IU]/L (ref 0–32)
AST: 29 [IU]/L (ref 0–40)
Albumin: 4.3 g/dL (ref 3.8–4.9)
Alkaline Phosphatase: 93 [IU]/L (ref 44–121)
BUN/Creatinine Ratio: 17 (ref 9–23)
BUN: 14 mg/dL (ref 6–24)
Bilirubin Total: 0.7 mg/dL (ref 0.0–1.2)
CO2: 28 mmol/L (ref 20–29)
Calcium: 9.4 mg/dL (ref 8.7–10.2)
Chloride: 100 mmol/L (ref 96–106)
Creatinine, Ser: 0.81 mg/dL (ref 0.57–1.00)
Globulin, Total: 2.8 g/dL (ref 1.5–4.5)
Glucose: 87 mg/dL (ref 70–99)
Potassium: 3.6 mmol/L (ref 3.5–5.2)
Sodium: 141 mmol/L (ref 134–144)
Total Protein: 7.1 g/dL (ref 6.0–8.5)
eGFR: 85 mL/min/{1.73_m2} (ref >59)

## 2023-10-13 LAB — ALLERGENS W/TOTAL IGE AREA 2
Alternaria Alternata IgE: <0.1 kU/L
Aspergillus Fumigatus IgE: <0.1 kU/L
Bermuda Grass IgE: <0.1 kU/L
Cat Dander IgE: <0.1 kU/L
Cedar, Mountain IgE: 0.15 kU/L — AB
Cladosporium Herbarum IgE: <0.1 kU/L
Cockroach, German IgE: <0.1 kU/L
Common Silver Birch IgE: 0.28 kU/L — AB
Cottonwood IgE: <0.1 kU/L
D Farinae IgE: <0.1 kU/L
D Pteronyssinus IgE: <0.1 kU/L
Dog Dander IgE: <0.1 kU/L
Elm, American IgE: <0.1 kU/L
Johnson Grass IgE: <0.1 kU/L
Maple/Box Elder IgE: <0.1 kU/L
Mouse Urine IgE: <0.1 kU/L
Oak, White IgE: 0.19 kU/L — AB
Pecan, Hickory IgE: <0.1 kU/L
Penicillium Chrysogen IgE: <0.1 kU/L
Pigweed, Rough IgE: <0.1 kU/L
Ragweed, Short IgE: <0.1 kU/L
Sheep Sorrel IgE Qn: <0.1 kU/L
Timothy Grass IgE: 0.2 kU/L — AB
White Mulberry IgE: <0.1 kU/L

## 2023-10-13 LAB — ALPHA-GAL PANEL
Allergen Lamb IgE: <0.1 kU/L
Beef IgE: <0.1 kU/L
IgE (Immunoglobulin E), Serum: 55 [IU]/mL (ref 6–495)
O215-IgE Alpha-Gal: <0.1 kU/L
Pork IgE: <0.1 kU/L

## 2023-10-13 LAB — CBC WITH DIFFERENTIAL/PLATELET
Basophils Absolute: 0 10*3/uL (ref 0.0–0.2)
Basos: 1 %
EOS (ABSOLUTE): 0.1 10*3/uL (ref 0.0–0.4)
Eos: 2 %
Hematocrit: 41.2 % (ref 34.0–46.6)
Hemoglobin: 13.8 g/dL (ref 11.1–15.9)
Immature Grans (Abs): 0 10*3/uL (ref 0.0–0.1)
Immature Granulocytes: 0 %
Lymphocytes Absolute: 1.7 10*3/uL (ref 0.7–3.1)
Lymphs: 37 %
MCH: 30.5 pg (ref 26.6–33.0)
MCHC: 33.5 g/dL (ref 31.5–35.7)
MCV: 91 fL (ref 79–97)
Monocytes Absolute: 0.4 10*3/uL (ref 0.1–0.9)
Monocytes: 8 %
Neutrophils Absolute: 2.5 10*3/uL (ref 1.4–7.0)
Neutrophils: 52 %
Platelets: 313 10*3/uL (ref 150–450)
RBC: 4.53 x10E6/uL (ref 3.77–5.28)
RDW: 12.4 % (ref 11.7–15.4)
WBC: 4.7 10*3/uL (ref 3.4–10.8)

## 2023-10-13 LAB — THYROID ANTIBODIES: Thyroperoxidase Ab SerPl-aCnc: 16 [IU]/mL (ref 0–34)

## 2023-10-13 LAB — TSH: TSH: 2.26 u[IU]/mL (ref 0.450–4.500)

## 2023-10-13 LAB — ALLERGEN, APPLE F49: Allergen Apple, IgE: <0.1 kU/L

## 2023-10-13 LAB — TRYPTASE: Tryptase: 6.8 ug/L (ref 2.2–13.2)

## 2023-10-13 LAB — CHRONIC URTICARIA: cu index: 2.6 (ref <10)

## 2023-10-13 LAB — THYROID ANTIBODIES (THYROPEROXIDASE & THYROGLOBULIN): Thyroglobulin Antibody: <1 [IU]/mL (ref 0.0–0.9)

## 2023-10-13 NOTE — Addendum Note (Signed)
Addended by: Kellie Simmering, Jaquasha Carnevale on: 10/13/2023 04:40 PM   Modules accepted: Orders

## 2023-10-20 MED ORDER — FEXOFENADINE HCL 180 MG PO TABS: 180.0000 mg | ORAL_TABLET | Freq: Every day | ORAL | 5 refills | Status: AC

## 2023-10-20 NOTE — Addendum Note (Signed)
Addended by: Orson Aloe on: 10/20/2023 09:10 AM   Modules accepted: Orders

## 2024-03-07 ENCOUNTER — Ambulatory Visit (INDEPENDENT_AMBULATORY_CARE_PROVIDER_SITE_OTHER): Payer: 59 | Admitting: Allergy

## 2024-03-07 ENCOUNTER — Encounter: Payer: Self-pay | Admitting: Allergy

## 2024-03-07 ENCOUNTER — Other Ambulatory Visit: Payer: Self-pay

## 2024-03-07 VITALS — BP 116/82 | HR 70 | Temp 97.7°F | Wt 274.6 lb

## 2024-03-07 DIAGNOSIS — L501 Idiopathic urticaria: Secondary | ICD-10-CM | POA: Diagnosis not present

## 2024-03-07 DIAGNOSIS — J45990 Exercise induced bronchospasm: Secondary | ICD-10-CM

## 2024-03-07 DIAGNOSIS — J3089 Other allergic rhinitis: Secondary | ICD-10-CM

## 2024-03-07 DIAGNOSIS — L409 Psoriasis, unspecified: Secondary | ICD-10-CM

## 2024-03-07 DIAGNOSIS — T781XXD Other adverse food reactions, not elsewhere classified, subsequent encounter: Secondary | ICD-10-CM

## 2024-03-07 DIAGNOSIS — K219 Gastro-esophageal reflux disease without esophagitis: Secondary | ICD-10-CM

## 2024-03-07 MED ORDER — AIRSUPRA 90-80 MCG/ACT IN AERO: 2.0000 | INHALATION_SPRAY | RESPIRATORY_TRACT | 5 refills | Status: DC | PRN

## 2024-03-07 MED ORDER — MONTELUKAST SODIUM 10 MG PO TABS: 10.0000 mg | ORAL_TABLET | Freq: Every day | ORAL | 5 refills | Status: DC

## 2024-03-07 MED ORDER — FLUTICASONE FUROATE-VILANTEROL 200-25 MCG/ACT IN AEPB: 1.0000 | INHALATION_SPRAY | Freq: Every day | RESPIRATORY_TRACT | 5 refills | Status: DC

## 2024-03-07 NOTE — Patient Instructions (Addendum)
 Allergic Rhinitis - tree pollen and grass pollen -Continue avoidance measures -Use Ryaltris nasal spray 2 sprays each nostril 1-2 times a day for nasal drainage or congestion control.  This is a combination spray with olopatadine (antihistamine) for drainage and mometasone (steroid) for congestion. -Alternate between Levocetirizine 5mg   and Allegra 180mg  daily.  If needed can take additional dose.  -Continue Montelukast 10mg  daily   Respiratory symptoms -Lung function testing looks great today -If having respiratory illness: take Breo 1 puff daily for 1-2 weeks or until symptoms have resolved.  -Use Airsupra, an as-needed inhaler, 2 puffs every 4-6 hours as needed for cough/wheeze/shortness of breath/chest tightness.  May use 15-20 minutes prior to activity.   Monitor frequency of use.    Gastroesophageal Reflux Disease (GERD) -Continue Nexium daily.  Psoriasis Long-standing diagnosis with patches on various parts of the body. 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  Hives can be caused by a variety of different triggers including illness/infection, foods, medications, stings, exercise, pressure, vibrations, extremes of temperature to name a few however majority of the time there is no identifiable trigger.   -Use Allegra/Levocetirizine as above.  Max dosing of antihistamine 4 tabs/day if needed  Follow-up in 6 months or sooner if needed

## 2024-03-07 NOTE — Progress Notes (Signed)
 Follow-up Note  RE: Cassie Mahoney MRN: [id number] DOB: 01-24-1966 Date of Office Visit: 03/07/2024   History of present illness: Cassie Mahoney is a 58 y.o. female presenting today for follow-up of allergic rhinitis, reactive airway, reflux, adverse food reaction, urticaria and history of psoriasis.  He was last seen in the office on 10/04/2023 by myself. Discussed the use of AI scribe software for clinical note transcription with the patient, who gave verbal consent to proceed.  She experiences increased allergy symptoms, particularly during the current pollen season. Symptoms are more noticeable at night, despite efforts to minimize exposure by keeping windows closed and using air conditioning. She is currently alternating between two antihistamines levocetirizine and Allegra to manage her symptoms and plans to start using Ryaltris soon. She continues to take montelukast (Singulair) and has about ten days left of her current supply. She has not needed to use her rescue inhaler, Airsupra, and has reduced her use of Breo, not having taken it in the last thirty days.  She has not noticed any increase in respiratory symptoms at all.  Her reflux is well-controlled with Nexium, and she has stopped using Pepcid.  She avoids certain foods, like gala apples, which have previously caused reactions, especially during high pollen counts.  She has not experienced any hives recently.  She is under dermatological care for psoriasis. Current treatments are only moderately effective, and she is exploring other options, including biologics, with her dermatologist.     Review of systems: 10pt ROS negative unless noted in HPI  Past medical/social/surgical/family history have been reviewed and are unchanged unless specifically indicated below.  No changes  Medication List: Current Outpatient Medications  Medication Sig Dispense Refill   Albuterol-Budesonide (AIRSUPRA) 90-80 MCG/ACT AERO Inhale 2 Inhalations  into the lungs every 4 (four) hours as needed (cough, shortness of breath, wheeze, chest tightness). 10.7 g 5   ascorbic acid (VITAMIN C) 100 MG tablet Take by mouth.     azelastine (ASTELIN) 0.1 % nasal spray Place into the nose.     calcipotriene (DOVONOX) 0.005 % cream Apply 0.005 Applications topically 2 (two) times daily.     Cholecalciferol 25 MCG (1000 UT) capsule Take by mouth.     Clobetasol Propionate (TEMOVATE) 0.05 % external spray Apply 0.05 % topically 2 (two) times daily.     clotrimazole-betamethasone (LOTRISONE) cream Apply 1 Application topically 2 (two) times daily.     EPINEPHrine 0.3 mg/0.3 mL IJ SOAJ injection Inject 0.3 mg into the muscle as needed for anaphylaxis. 2 each 1   famotidine (PEPCID) 20 MG tablet Take by mouth.     fexofenadine (ALLEGRA ALLERGY) 180 MG tablet Take 1 tablet (180 mg total) by mouth daily. 30 tablet 5   fluconazole (DIFLUCAN) 150 MG tablet Take 150 mg by mouth once.     fluticasone furoate-vilanterol (BREO ELLIPTA) 200-25 MCG/ACT AEPB Inhale into the lungs.     losartan-hydrochlorothiazide (HYZAAR) 100-25 MG tablet Take by mouth.     montelukast (SINGULAIR) 10 MG tablet Take by mouth.     Multiple Vitamin (MULTIVITAMIN) capsule Take by mouth.     potassium chloride (KLOR-CON) 20 MEQ packet Take by mouth.     Olopatadine-Mometasone (RYALTRIS) [id number] MCG/ACT SUSP Place 2 sprays into the nose 2 (two) times daily. (Patient not taking: Reported on 03/07/2024) 29 g 5   omeprazole (PRILOSEC) 10 MG capsule Take by mouth. (Patient not taking: Reported on 03/07/2024)     No current facility-administered medications for this visit.  Known medication allergies: Allergies  Allergen Reactions   Latex Hives   Amlodipine Besylate Itching   Apple Itching     Physical examination: Blood pressure 116/82, pulse 70, temperature 97.7 F (36.5 C), temperature source Temporal, weight 274 lb 9.6 oz (124.6 kg), SpO2 97%.  General: Alert, interactive, in no  acute distress. HEENT: PERRLA, TMs pearly gray, turbinates mildly edematous without discharge, post-pharynx non erythematous. Neck: Supple without lymphadenopathy. Lungs: Clear to auscultation without wheezing, rhonchi or rales. {no increased work of breathing. CV: Normal S1, S2 without murmurs. Abdomen: Nondistended, nontender. Skin: Warm and dry, without lesions or rashes. Extremities:  No clubbing, cyanosis or edema. Neuro:   Grossly intact.  Diagnositics/Labs:  Spirometry: FEV1: 2.63L 104%, FVC: 3.29L 103%, ratio consistent with nonobstructive  Assessment and plan: Allergic Rhinitis - tree pollen and grass pollen -Continue avoidance measures -Use Ryaltris nasal spray 2 sprays each nostril 1-2 times a day for nasal drainage or congestion control.  This is a combination spray with olopatadine (antihistamine) for drainage and mometasone (steroid) for congestion. -Alternate between Levocetirizine 5mg   and Allegra 180mg  daily.  If needed can take additional dose.  -Continue Montelukast 10mg  daily   Respiratory symptoms -exercise-induced bronchospasm -Lung function testing looks great today -If having respiratory illness: take Breo 1 puff daily for 1-2 weeks or until symptoms have resolved.  -Use Airsupra, an as-needed inhaler, 2 puffs every 4-6 hours as needed for cough/wheeze/shortness of breath/chest tightness.  May use 15-20 minutes prior to activity.   Monitor frequency of use.    Gastroesophageal Reflux Disease (GERD) -Continue Nexium daily.  Psoriasis Long-standing diagnosis with patches on various parts of the body. 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  Hives can be caused by a variety of different triggers including illness/infection, foods, medications, stings, exercise, pressure, vibrations, extremes of temperature to name a few however majority of the time there is no identifiable trigger.   -Use Allegra/Levocetirizine as above.  Max dosing of antihistamine 4 tabs/day if needed  Follow-up in 6 months or sooner if needed  I appreciate the opportunity to take part in [place] care. Please do not hesitate to contact me with questions.  Sincerely,   Margo Aye, MD Allergy/Immunology [facility]

## 2024-03-15 ENCOUNTER — Telehealth: Payer: Self-pay | Admitting: Allergy

## 2024-03-15 NOTE — Telephone Encounter (Signed)
 Patient called and stated that she has been taking her allergy medicine at night, but when she goes outside in the morning her throat is itchy and her face is itchy. She also says that her eyes are so itchy that "she wants to take them out and rinse them off." Patient wants to know if there is anything else she can take for her symptoms. [facility] [address]. Patients call back number is [phone]

## 2024-05-03 ENCOUNTER — Telehealth: Payer: Self-pay | Admitting: Allergy

## 2024-05-03 NOTE — Telephone Encounter (Signed)
 Cassie Mahoney called and stated that she would like someone to call her back with advise as she is experiencing intense itching on the lower part of her face and neck, and she has welts on her neck and collar bone. [phone]

## 2024-05-08 NOTE — Telephone Encounter (Signed)
 Left message to call the office.

## 2024-05-09 NOTE — Telephone Encounter (Signed)
 Patient called back and expressed that she did call her demonologist as well yesterday and they prescribed her with triamcinolone. Patient stated that she is taking that and doing her antihistamines twice daily. She also expressed that the itching has decreased but the whelps were still present. Patient was offered an appointment but stated that since she discussed a treatment plan with her dermatologist she will use that and call us  back if she feels the current treatment plan isn't working. Patient expressed that when talking to her dermatologist they concluded that she has an allergy to the sun and she was at the beach over the weekend. Patient wanted to make Dr.Padgett aware so they can discuss it at her next visit in September.

## 2024-06-07 ENCOUNTER — Other Ambulatory Visit: Payer: Self-pay | Admitting: Internal Medicine

## 2024-06-07 DIAGNOSIS — K2970 Gastritis, unspecified, without bleeding: Secondary | ICD-10-CM

## 2024-06-19 ENCOUNTER — Inpatient Hospital Stay
Admission: RE | Admit: 2024-06-19 | Discharge: 2024-06-19 | Discharge status: Home / Self Care | Payer: Self-pay | Source: Ambulatory Visit | Attending: Internal Medicine | Admitting: Internal Medicine

## 2024-06-19 DIAGNOSIS — K2970 Gastritis, unspecified, without bleeding: Secondary | ICD-10-CM

## 2024-06-19 MED ORDER — IOPAMIDOL (ISOVUE-370) INJECTION 76%
100.0000 mL | Freq: Once | INTRAVENOUS | Status: AC | PRN
  Administered 2024-06-19: 100 mL via INTRAVENOUS

## 2024-09-01 ENCOUNTER — Other Ambulatory Visit: Payer: Self-pay | Admitting: Allergy

## 2024-09-05 ENCOUNTER — Ambulatory Visit: Admitting: Allergy

## 2024-09-05 ENCOUNTER — Encounter: Payer: Self-pay | Admitting: Allergy

## 2024-09-05 ENCOUNTER — Other Ambulatory Visit: Payer: Self-pay

## 2024-09-05 VITALS — BP 108/80 | HR 76 | Temp 97.9°F | Wt 271.7 lb

## 2024-09-05 DIAGNOSIS — J45990 Exercise induced bronchospasm: Secondary | ICD-10-CM | POA: Diagnosis not present

## 2024-09-05 DIAGNOSIS — T781XXD Other adverse food reactions, not elsewhere classified, subsequent encounter: Secondary | ICD-10-CM

## 2024-09-05 DIAGNOSIS — J3089 Other allergic rhinitis: Secondary | ICD-10-CM

## 2024-09-05 DIAGNOSIS — L501 Idiopathic urticaria: Secondary | ICD-10-CM

## 2024-09-05 DIAGNOSIS — K219 Gastro-esophageal reflux disease without esophagitis: Secondary | ICD-10-CM

## 2024-09-05 DIAGNOSIS — L409 Psoriasis, unspecified: Secondary | ICD-10-CM

## 2024-09-05 MED ORDER — MONTELUKAST SODIUM 10 MG PO TABS: 10.0000 mg | ORAL_TABLET | Freq: Every day | ORAL | 5 refills | Status: AC

## 2024-09-05 MED ORDER — AIRSUPRA 90-80 MCG/ACT IN AERO: 2.0000 | INHALATION_SPRAY | RESPIRATORY_TRACT | 5 refills | Status: AC | PRN

## 2024-09-05 MED ORDER — LEVOCETIRIZINE DIHYDROCHLORIDE 5 MG PO TABS: 5.0000 mg | ORAL_TABLET | Freq: Two times a day (BID) | ORAL | 5 refills | Status: AC

## 2024-09-05 MED ORDER — FLUTICASONE FUROATE-VILANTEROL 200-25 MCG/ACT IN AEPB: 1.0000 | INHALATION_SPRAY | Freq: Every day | RESPIRATORY_TRACT | 5 refills | Status: AC

## 2024-09-05 MED ORDER — RYALTRIS 665-25 MCG/ACT NA SUSP: 2.0000 | Freq: Two times a day (BID) | NASAL | 5 refills | Status: AC

## 2024-09-05 MED ORDER — FAMOTIDINE 20 MG PO TABS: 20.0000 mg | ORAL_TABLET | Freq: Two times a day (BID) | ORAL | 5 refills | Status: DC

## 2024-09-05 NOTE — Patient Instructions (Addendum)
 Allergic Rhinitis - tree pollen and grass pollen -Continue avoidance measures -Use Ryaltris  nasal spray 2 sprays each nostril 1-2 times a day for nasal drainage or congestion control.  This is a combination spray with olopatadine (antihistamine) for drainage and mometasone (steroid) for congestion. -Continue use of Levocetirizine (as below) -Continue Montelukast  10mg  daily   Respiratory symptoms -Lung function testing looks lower today -If having respiratory illness: take Breo 1 puff daily for 1-2 weeks or until symptoms have resolved.  -Use Airsupra , an as-needed inhaler, 2 puffs every 4-6 hours as needed for cough/wheeze/shortness of breath/chest tightness.  May use 15-20 minutes prior to activity.   Monitor frequency of use.    Gastroesophageal Reflux Disease (GERD) -Use Nexium as needed for reflux symptoms - if needed can use with Pepcid .  Psoriasis Long-standing diagnosis with patches on various parts of the body. 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  Hives can be caused by a variety of different triggers including illness/infection, foods, medications, stings, exercise, pressure, vibrations, extremes of temperature to name a few however majority of the time there is no identifiable trigger.   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   Follow-up in 4-6 months or  sooner if needed

## 2024-09-05 NOTE — Progress Notes (Signed)
 Follow-up Note  RE: Cassie Mahoney MRN: [id number] DOB: [birth year] Date of Office Visit: 09/05/2024   History of present illness: Cassie Mahoney is a 58 y.o. female presenting today for follow-up of allergic rhinitis, asthma, hives, reflux, adverse food reaction.  She was last seen in the office on 03/07/24 by myself.  Discussed the use of AI scribe software for clinical note transcription with the patient, who gave verbal consent to proceed.  She has been experiencing recurrent hives for about a year and a half to two years, initially triggered by sun exposure at the beach. 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 This year, the frequency and severity of hives have increased significantly, even with reduced sun exposure. The hives, described as 'welts', have been persistent throughout the year.  She manages her hives with levocetirizine, taking one tablet at night, and montelukast  in the morning. However, she feels that the current regimen is not adequately controlling her symptoms, as this year has been particularly challenging with more frequent and severe outbreaks.  In addition to hives, she has a history of allergies, particularly to tree and grass pollens, which have been unusually high this year. She uses a nasal spray, Ryaltris , on an as-needed basis and has an AirSupra  inhaler for rescue, which she has used infrequently since March.  She also has a history of reflux, for which she has used omeprazole in the past, but she is currently not taking it as her symptoms are controlled. She avoids gala apples due to a previous adverse reaction and maintains an EpiPen  for emergencies, although she has not needed to use it.  She is able to eat other kinds of apples without issue.   Review of systems: 10pt  ROS negative unless noted above in HPI  Past medical/social/surgical/family history have been  reviewed and are unchanged unless specifically indicated below.  No changes  Medication List: Current Outpatient Medications  Medication Sig Dispense Refill   Albuterol-Budesonide (AIRSUPRA ) 90-80 MCG/ACT AERO Inhale 2 Inhalations into the lungs every 4 (four) hours as needed (cough, shortness of breath, wheeze, chest tightness). 10.7 g 5   ascorbic acid (VITAMIN C) 100 MG tablet Take by mouth.     azelastine (ASTELIN) 0.1 % nasal spray Place into the nose. (Patient taking differently: Place 2 sprays into both nostrils as needed.)     calcipotriene (DOVONOX) 0.005 % cream Apply 0.005 Applications topically 2 (two) times daily. (Patient taking differently: Apply 0.005 Applications topically as needed.)     Cholecalciferol 25 MCG (1000 UT) capsule Take by mouth.     Clobetasol Propionate (TEMOVATE) 0.05 % external spray Apply 0.05 % topically 2 (two) times daily.     clotrimazole-betamethasone (LOTRISONE) cream Apply 1 Application topically 2 (two) times daily.     EPINEPHrine  0.3 mg/0.3 mL IJ SOAJ injection Inject 0.3 mg into the muscle as needed for anaphylaxis. 2 each 1   famotidine  (PEPCID ) 20 MG tablet Take by mouth.     fexofenadine  (ALLEGRA  ALLERGY) 180 MG tablet Take 1 tablet (180 mg total) by mouth daily. 30 tablet 5   fluconazole (DIFLUCAN) 150 MG tablet Take 150 mg by mouth once.     fluticasone  furoate-vilanterol (BREO ELLIPTA ) 200-25 MCG/ACT AEPB Inhale 1 puff into the lungs daily. I puff daily for 1-2 weeks till symptoms resolved 60 each 5   losartan-hydrochlorothiazide (HYZAAR) 100-25 MG tablet Take by mouth.     montelukast  (SINGULAIR ) 10  MG tablet TAKE 1 TABLET BY MOUTH DAILY 30 tablet 0   Multiple Vitamin (MULTIVITAMIN) capsule Take by mouth.     Olopatadine-Mometasone (RYALTRIS ) 665-25 MCG/ACT SUSP Place 2 sprays into the nose 2 (two) times daily. (Patient taking differently: Place 2 sprays into the nose as needed.) 29 g 5   omeprazole (PRILOSEC) 10 MG capsule Take by mouth.  (Patient taking differently: Take 20 mg by mouth as needed.)     potassium chloride (KLOR-CON) 20 MEQ packet Take by mouth.     Calcipotriene 0.005 % solution      Clobetasol Propionate (TEMOVATE) 0.05 % external spray Apply 1 Application topically as needed.     triamcinolone cream (KENALOG) 0.1 % Apply 1 Application topically as needed.     No current facility-administered medications for this visit.     Known medication allergies: Allergies  Allergen Reactions   Latex Hives   Amlodipine Besylate Itching   Apple Itching     Physical examination: Blood pressure 108/80, pulse 76, temperature 97.9 F (36.6 C), temperature source Temporal, weight 271 lb 11.2 oz (123.2 kg), SpO2 100%.  General: Alert, interactive, in no acute distress. HEENT: PERRLA, TMs pearly gray, turbinates minimally edematous without discharge, post-pharynx non erythematous. Neck: Supple without lymphadenopathy. Lungs: Clear to auscultation without wheezing, rhonchi or rales. {no increased work of breathing. CV: Normal S1, S2 without murmurs. Abdomen: Nondistended, nontender. Skin: Warm and dry, without lesions or rashes. Extremities:  No clubbing, cyanosis or edema. Neuro:   Grossly intact.  Diagnostics/Labs: Spirometry: FEV1: 2.44 L or 97%, FVC: 3.37 L or 106%, ratio consistent with nonobstructive pattern  Assessment and plan: Allergic Rhinitis - tree pollen and grass pollen -Continue avoidance measures -Use Ryaltris  nasal spray 2 sprays each nostril 1-2 times a day for nasal drainage or congestion control.  This is a combination spray with olopatadine (antihistamine) for drainage and mometasone (steroid) for congestion. -Continue use of Levocetirizine (as below) -Continue Montelukast  10mg  daily   Exercise-induced bronchospasm -Lung function testing looks lower today -If having respiratory illness: take Breo 1 puff daily for 1-2 weeks or until symptoms have resolved.  -Use Airsupra , an as-needed  inhaler, 2 puffs every 4-6 hours as needed for cough/wheeze/shortness of breath/chest tightness.  May use 15-20 minutes prior to activity.   Monitor frequency of use.    Gastroesophageal Reflux Disease (GERD) -Use Nexium as needed for reflux symptoms - if needed can use with Pepcid .  Psoriasis Long-standing diagnosis with patches on various parts of the body. 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  Hives can be caused by a variety of different triggers including illness/infection, foods, medications, stings, exercise, pressure, vibrations, extremes of temperature to name a few however majority of the time there is no identifiable trigger.   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   Follow-up in 4-6 months or sooner if needed  I appreciate the opportunity to take part in  Lynnann's care. Please do not hesitate to contact me with questions.  Sincerely,   Danita Brain, MD Allergy/Immunology [facility] 

## 2024-09-06 ENCOUNTER — Telehealth: Payer: Self-pay

## 2024-09-06 NOTE — Telephone Encounter (Signed)
*  AA  Pharmacy Patient Advocate Encounter   Received notification from CoverMyMeds that prior authorization for Airsupra  90-80MCG/ACT aerosol   is required/requested.   Insurance verification completed.   The patient is insured through [facility] [employer] .   Per test claim: PA required; PA started via CoverMyMeds. KEY [id number] . Waiting for clinical questions to populate.

## 2024-09-06 NOTE — Telephone Encounter (Signed)
 Prior Authorization form/request asks a question that requires your assistance. Please see the question below and advise accordingly. The PA will not be submitted until the necessary information is received.   Please list ALL medications that the patient has tried and failed or has a contraindication or intolerance to:

## 2024-09-09 ENCOUNTER — Telehealth: Payer: Self-pay

## 2024-09-09 NOTE — Telephone Encounter (Signed)
*  AA  Pharmacy Patient Advocate Encounter   Received notification from Fax that prior authorization for Ryaltris  665-25MCG/ACT suspension   is required/requested.   Insurance verification completed.   The patient is insured through [employer] .   Per test claim: PA required; PA started via CoverMyMeds. KEY [id number] . Please see clinical question(s) below that I am not finding the answer to in their chart and advise.   Does the patient have a history of failure, contraindication or intolerance to ONE of the following: 1) olopatadine (generic Patanase) PLUS an over-the-counter nasal steroid (for example, Nasonex Allergy) OR 2) over-the-counter Astepro Allergy PLUS an over-the-counter nasal steroid (for example, Nasonex Allergy)?

## 2024-09-12 NOTE — Telephone Encounter (Signed)
 PT returned call, I advised of Padgett's note re: Airsupra  and Insurance coverage.  PT stated that she was given insurance card and copay for Airsupra  should be $0, so should be good to use - I advised would get message back to clinical

## 2024-09-12 NOTE — Telephone Encounter (Signed)
 Called and left voicemail requesting a callback in regards to change in inhaler.   Please let her know Dr. Jeneal suggestion if patient calls back.

## 2024-09-12 NOTE — Telephone Encounter (Signed)
 Pt used copay card and received airsupra  from pharmacy.

## 2024-09-16 NOTE — Telephone Encounter (Signed)
 Sent mychart message to inform her of the change due to insurance denying coverage of Ryaltris .

## 2024-12-13 ENCOUNTER — Other Ambulatory Visit: Payer: Self-pay | Admitting: Allergy

## 2024-12-31 ENCOUNTER — Other Ambulatory Visit: Payer: Self-pay | Admitting: Allergy

## 2025-02-05 ENCOUNTER — Ambulatory Visit: Admitting: Allergy
